# Patient Record
Sex: Male | Born: 1995 | Race: Black or African American | Hispanic: No | Marital: Single | Smoking: Never smoker
Health system: Southern US, Community
[De-identification: ages and names within clinical notes are randomized; demographics above are authoritative.]

---
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
## 2017-04-29 DIAGNOSIS — K080 Exfoliation of teeth due to systemic causes: Secondary | ICD-10-CM | POA: Diagnosis not present

## 2017-12-02 DIAGNOSIS — Z113 Encounter for screening for infections with a predominantly sexual mode of transmission: Secondary | ICD-10-CM | POA: Diagnosis not present

## 2018-01-11 DIAGNOSIS — K080 Exfoliation of teeth due to systemic causes: Secondary | ICD-10-CM | POA: Diagnosis not present

## 2018-01-26 DIAGNOSIS — K006 Disturbances in tooth eruption: Secondary | ICD-10-CM | POA: Diagnosis not present

## 2018-01-26 DIAGNOSIS — K011 Impacted teeth: Secondary | ICD-10-CM | POA: Diagnosis not present

## 2018-02-10 DIAGNOSIS — K011 Impacted teeth: Secondary | ICD-10-CM | POA: Diagnosis not present

## 2018-02-10 DIAGNOSIS — K006 Disturbances in tooth eruption: Secondary | ICD-10-CM | POA: Diagnosis not present

## 2018-04-13 DIAGNOSIS — J101 Influenza due to other identified influenza virus with other respiratory manifestations: Secondary | ICD-10-CM | POA: Diagnosis not present

## 2018-09-07 DIAGNOSIS — Z202 Contact with and (suspected) exposure to infections with a predominantly sexual mode of transmission: Secondary | ICD-10-CM | POA: Diagnosis not present

## 2019-05-05 ENCOUNTER — Ambulatory Visit: Payer: Federal, State, Local not specified - PPO | Attending: Internal Medicine

## 2019-08-08 DIAGNOSIS — L723 Sebaceous cyst: Secondary | ICD-10-CM | POA: Diagnosis not present

## 2019-11-08 DIAGNOSIS — M79645 Pain in left finger(s): Secondary | ICD-10-CM | POA: Diagnosis not present

## 2019-11-24 DIAGNOSIS — Z113 Encounter for screening for infections with a predominantly sexual mode of transmission: Secondary | ICD-10-CM | POA: Diagnosis not present

## 2020-04-05 DIAGNOSIS — Z202 Contact with and (suspected) exposure to infections with a predominantly sexual mode of transmission: Secondary | ICD-10-CM | POA: Diagnosis not present

## 2020-04-05 DIAGNOSIS — Z7251 High risk heterosexual behavior: Secondary | ICD-10-CM | POA: Diagnosis not present

## 2020-04-05 DIAGNOSIS — N4889 Other specified disorders of penis: Secondary | ICD-10-CM | POA: Diagnosis not present

## 2020-05-14 DIAGNOSIS — N390 Urinary tract infection, site not specified: Secondary | ICD-10-CM | POA: Diagnosis not present

## 2020-05-16 ENCOUNTER — Other Ambulatory Visit: Payer: Self-pay | Admitting: Sports Medicine

## 2020-05-16 ENCOUNTER — Ambulatory Visit (INDEPENDENT_AMBULATORY_CARE_PROVIDER_SITE_OTHER): Payer: Federal, State, Local not specified - PPO

## 2020-05-16 ENCOUNTER — Other Ambulatory Visit: Payer: Self-pay

## 2020-05-16 ENCOUNTER — Encounter: Payer: Self-pay | Admitting: Sports Medicine

## 2020-05-16 ENCOUNTER — Ambulatory Visit: Payer: Federal, State, Local not specified - PPO | Admitting: Sports Medicine

## 2020-05-16 DIAGNOSIS — M2041 Other hammer toe(s) (acquired), right foot: Secondary | ICD-10-CM

## 2020-05-16 DIAGNOSIS — M792 Neuralgia and neuritis, unspecified: Secondary | ICD-10-CM

## 2020-05-16 DIAGNOSIS — M79671 Pain in right foot: Secondary | ICD-10-CM

## 2020-05-16 DIAGNOSIS — N390 Urinary tract infection, site not specified: Secondary | ICD-10-CM | POA: Insufficient documentation

## 2020-05-16 DIAGNOSIS — N4889 Other specified disorders of penis: Secondary | ICD-10-CM | POA: Insufficient documentation

## 2020-05-16 DIAGNOSIS — N342 Other urethritis: Secondary | ICD-10-CM | POA: Diagnosis not present

## 2020-05-16 DIAGNOSIS — R300 Dysuria: Secondary | ICD-10-CM | POA: Diagnosis not present

## 2020-05-16 NOTE — Progress Notes (Signed)
Subjective: Randall Mcbride is a 25 y.o. male patient who presents to office for evaluation of right foot pain. Patient reports that his right 5th toe was numb 2 weeks ago.  Reports that he thinks he may have started after wearing tight steel toe shoes.  Patient reports the pain is better now.  Denies any injury and reports that is gradually getting better on its own.  No other pedal complaints noted.  Review of systems noncontributory.  Patient Active Problem List   Diagnosis Date Noted  . Penile irritation 05/16/2020  . Urinary tract infection 05/16/2020    Current Outpatient Medications on File Prior to Visit  Medication Sig Dispense Refill  . ciprofloxacin (CIPRO) 500 MG tablet Take 500 mg by mouth 2 (two) times daily.    Marland Kitchen oxyCODONE-acetaminophen (ROXICET) 5-325 MG per tablet Take 1 tablet by mouth every 4 (four) hours as needed for pain. (Patient not taking: Reported on 05/16/2020) 20 tablet 0   No current facility-administered medications on file prior to visit.    No Known Allergies  Objective:  General: Alert and oriented x3 in no acute distress  Dermatology: No open lesions bilateral lower extremities, no webspace macerations, no ecchymosis bilateral, all nails x 10 are well manicured.  Vascular: Dorsalis Pedis and Posterior Tibial pedal pulses palpable, Capillary Fill Time 3 seconds,(+) pedal hair growth bilateral, no edema bilateral lower extremities, Temperature gradient within normal limits.  Neurology: Michaell Cowing sensation intact via light touch bilateral.  Musculoskeletal: No reproducible tenderness to palpation on the right foot especially at the fifth toe.  There is varus hammertoe deformity noted bilateral.  Gait: Non-Antalgic gait  Xrays  Right foot   Impression: Varus rotated fifth hammertoe.  Assessment and Plan: Problem List Items Addressed This Visit   None   Visit Diagnoses    Pain in right foot    -  Primary   Hammertoe of right foot       Neuritis            -Complete examination performed -Xrays reviewed -Discussed treatment options for transient neuritis that is slowly improving -Advised patient to continue with monitoring -Avoid shoes that could rub or irritate the toe -If pain flares or worsens may try topical pain cream or rub or Epson salt -Patient to return to office as needed or sooner if condition worsens.  Asencion Islam, DPM

## 2020-05-18 DIAGNOSIS — R300 Dysuria: Secondary | ICD-10-CM | POA: Diagnosis not present

## 2020-05-18 DIAGNOSIS — Z202 Contact with and (suspected) exposure to infections with a predominantly sexual mode of transmission: Secondary | ICD-10-CM | POA: Diagnosis not present

## 2020-10-10 DIAGNOSIS — R197 Diarrhea, unspecified: Secondary | ICD-10-CM | POA: Diagnosis not present

## 2020-10-10 DIAGNOSIS — R1110 Vomiting, unspecified: Secondary | ICD-10-CM | POA: Diagnosis not present

## 2020-10-10 DIAGNOSIS — R6889 Other general symptoms and signs: Secondary | ICD-10-CM | POA: Diagnosis not present

## 2020-10-10 DIAGNOSIS — R519 Headache, unspecified: Secondary | ICD-10-CM | POA: Diagnosis not present

## 2020-10-10 DIAGNOSIS — Z03818 Encounter for observation for suspected exposure to other biological agents ruled out: Secondary | ICD-10-CM | POA: Diagnosis not present

## 2020-10-22 DIAGNOSIS — N5089 Other specified disorders of the male genital organs: Secondary | ICD-10-CM | POA: Diagnosis not present

## 2020-10-22 DIAGNOSIS — Z202 Contact with and (suspected) exposure to infections with a predominantly sexual mode of transmission: Secondary | ICD-10-CM | POA: Diagnosis not present

## 2021-01-02 DIAGNOSIS — L02214 Cutaneous abscess of groin: Secondary | ICD-10-CM | POA: Diagnosis not present

## 2021-03-20 DIAGNOSIS — Z202 Contact with and (suspected) exposure to infections with a predominantly sexual mode of transmission: Secondary | ICD-10-CM | POA: Diagnosis not present

## 2021-03-20 DIAGNOSIS — R369 Urethral discharge, unspecified: Secondary | ICD-10-CM | POA: Diagnosis not present

## 2021-09-15 DIAGNOSIS — N200 Calculus of kidney: Secondary | ICD-10-CM | POA: Diagnosis not present

## 2021-11-08 DIAGNOSIS — J3489 Other specified disorders of nose and nasal sinuses: Secondary | ICD-10-CM | POA: Diagnosis not present

## 2021-11-08 DIAGNOSIS — Z03818 Encounter for observation for suspected exposure to other biological agents ruled out: Secondary | ICD-10-CM | POA: Diagnosis not present

## 2021-11-08 DIAGNOSIS — J029 Acute pharyngitis, unspecified: Secondary | ICD-10-CM | POA: Diagnosis not present

## 2022-02-27 DIAGNOSIS — R5383 Other fatigue: Secondary | ICD-10-CM | POA: Diagnosis not present

## 2022-02-27 DIAGNOSIS — U071 COVID-19: Secondary | ICD-10-CM | POA: Diagnosis not present

## 2022-02-27 DIAGNOSIS — R051 Acute cough: Secondary | ICD-10-CM | POA: Diagnosis not present

## 2022-02-27 DIAGNOSIS — R0981 Nasal congestion: Secondary | ICD-10-CM | POA: Diagnosis not present

## 2022-04-22 DIAGNOSIS — M9905 Segmental and somatic dysfunction of pelvic region: Secondary | ICD-10-CM | POA: Diagnosis not present

## 2022-04-22 DIAGNOSIS — M9902 Segmental and somatic dysfunction of thoracic region: Secondary | ICD-10-CM | POA: Diagnosis not present

## 2022-04-22 DIAGNOSIS — M9903 Segmental and somatic dysfunction of lumbar region: Secondary | ICD-10-CM | POA: Diagnosis not present

## 2022-04-22 DIAGNOSIS — M9904 Segmental and somatic dysfunction of sacral region: Secondary | ICD-10-CM | POA: Diagnosis not present

## 2022-12-31 ENCOUNTER — Encounter (HOSPITAL_BASED_OUTPATIENT_CLINIC_OR_DEPARTMENT_OTHER): Payer: Self-pay | Admitting: Urology

## 2022-12-31 ENCOUNTER — Emergency Department (HOSPITAL_BASED_OUTPATIENT_CLINIC_OR_DEPARTMENT_OTHER): Payer: Federal, State, Local not specified - PPO

## 2022-12-31 ENCOUNTER — Emergency Department (HOSPITAL_BASED_OUTPATIENT_CLINIC_OR_DEPARTMENT_OTHER)
Admission: EM | Admit: 2022-12-31 | Discharge: 2022-12-31 | Disposition: A | Discharge status: Home / Self Care | Payer: Federal, State, Local not specified - PPO | Attending: Emergency Medicine | Admitting: Emergency Medicine

## 2022-12-31 ENCOUNTER — Other Ambulatory Visit: Payer: Self-pay

## 2022-12-31 DIAGNOSIS — Y92410 Unspecified street and highway as the place of occurrence of the external cause: Secondary | ICD-10-CM | POA: Insufficient documentation

## 2022-12-31 DIAGNOSIS — R55 Syncope and collapse: Secondary | ICD-10-CM | POA: Insufficient documentation

## 2022-12-31 DIAGNOSIS — Z03818 Encounter for observation for suspected exposure to other biological agents ruled out: Secondary | ICD-10-CM | POA: Diagnosis not present

## 2022-12-31 DIAGNOSIS — R051 Acute cough: Secondary | ICD-10-CM | POA: Diagnosis not present

## 2022-12-31 DIAGNOSIS — J069 Acute upper respiratory infection, unspecified: Secondary | ICD-10-CM | POA: Diagnosis not present

## 2022-12-31 LAB — BASIC METABOLIC PANEL
Anion gap: 9 (ref 5–15)
BUN: 13 mg/dL (ref 6–20)
CO2: 23 mmol/L (ref 22–32)
Calcium: 8.7 mg/dL — ABNORMAL LOW (ref 8.9–10.3)
Chloride: 103 mmol/L (ref 98–111)
Creatinine, Ser: 1.06 mg/dL (ref 0.61–1.24)
GFR, Estimated: >60 mL/min (ref >60)
Glucose, Bld: 92 mg/dL (ref 70–99)
Potassium: 3.8 mmol/L (ref 3.5–5.1)
Sodium: 135 mmol/L (ref 135–145)

## 2022-12-31 LAB — CBC
HCT: 47.3 % (ref 39.0–52.0)
Hemoglobin: 15.5 g/dL (ref 13.0–17.0)
MCH: 26.1 pg (ref 26.0–34.0)
MCHC: 32.8 g/dL (ref 30.0–36.0)
MCV: 79.8 fL — ABNORMAL LOW (ref 80.0–100.0)
Platelets: 233 10*3/uL (ref 150–400)
RBC: 5.93 MIL/uL — ABNORMAL HIGH (ref 4.22–5.81)
RDW: 12.9 % (ref 11.5–15.5)
WBC: 8.1 10*3/uL (ref 4.0–10.5)
nRBC: 0 % (ref 0.0–0.2)
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
## 2022-12-31 NOTE — ED Notes (Signed)
COVID, FLU, RSV negative today
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
## 2022-12-31 NOTE — ED Notes (Signed)
Patient transported to X-ray 

## 2023-01-05 DIAGNOSIS — M67911 Unspecified disorder of synovium and tendon, right shoulder: Secondary | ICD-10-CM | POA: Diagnosis not present

## 2023-01-05 DIAGNOSIS — M25511 Pain in right shoulder: Secondary | ICD-10-CM | POA: Diagnosis not present

## 2023-01-05 DIAGNOSIS — M25551 Pain in right hip: Secondary | ICD-10-CM | POA: Diagnosis not present

## 2023-04-21 DIAGNOSIS — Z8616 Personal history of COVID-19: Secondary | ICD-10-CM | POA: Diagnosis not present
